# Patient Record
Sex: Female | Born: 1985 | Race: Black or African American | Hispanic: No | Marital: Single | Smoking: Former smoker
Health system: Southern US, Community
[De-identification: ages and names within clinical notes are randomized; demographics above are authoritative.]

## PROBLEM LIST (undated history)

## (undated) DIAGNOSIS — N83209 Unspecified ovarian cyst, unspecified side: Secondary | ICD-10-CM

---

## 2012-07-20 ENCOUNTER — Encounter (HOSPITAL_COMMUNITY): Payer: Self-pay

## 2012-07-20 ENCOUNTER — Emergency Department (HOSPITAL_COMMUNITY)
Admission: EM | Admit: 2012-07-20 | Discharge: 2012-07-20 | Disposition: A | Discharge status: Home / Self Care | Payer: Medicaid Other | Source: Home / Self Care | Attending: Emergency Medicine | Admitting: Emergency Medicine

## 2012-07-20 ENCOUNTER — Emergency Department (HOSPITAL_COMMUNITY)
Admission: EM | Admit: 2012-07-20 | Discharge: 2012-07-20 | Disposition: A | Discharge status: Home / Self Care | Payer: Medicaid Other | Attending: Emergency Medicine | Admitting: Emergency Medicine

## 2012-07-20 ENCOUNTER — Encounter (HOSPITAL_COMMUNITY): Payer: Self-pay | Admitting: *Deleted

## 2012-07-20 DIAGNOSIS — F17200 Nicotine dependence, unspecified, uncomplicated: Secondary | ICD-10-CM | POA: Insufficient documentation

## 2012-07-20 DIAGNOSIS — K0889 Other specified disorders of teeth and supporting structures: Secondary | ICD-10-CM

## 2012-07-20 DIAGNOSIS — K089 Disorder of teeth and supporting structures, unspecified: Secondary | ICD-10-CM | POA: Insufficient documentation

## 2012-07-20 DIAGNOSIS — Z87828 Personal history of other (healed) physical injury and trauma: Secondary | ICD-10-CM | POA: Insufficient documentation

## 2012-07-20 DIAGNOSIS — K047 Periapical abscess without sinus: Secondary | ICD-10-CM

## 2012-07-20 DIAGNOSIS — R220 Localized swelling, mass and lump, head: Secondary | ICD-10-CM | POA: Insufficient documentation

## 2012-07-20 MED ORDER — IBUPROFEN 800 MG PO TABS
800.0000 mg | ORAL_TABLET | Freq: Once | ORAL | Status: AC
  Administered 2012-07-20: 800 mg via ORAL
  Filled 2012-07-20: qty 1

## 2012-07-20 MED ORDER — IBUPROFEN 800 MG PO TABS: 800.0000 mg | ORAL_TABLET | Freq: Three times a day (TID) | ORAL | Status: DC

## 2012-07-20 MED ORDER — HYDROCODONE-ACETAMINOPHEN 5-325 MG PO TABS: 1.0000 | ORAL_TABLET | ORAL | Status: DC | PRN

## 2012-07-20 MED ORDER — AMOXICILLIN 500 MG PO CAPS: 500.0000 mg | ORAL_CAPSULE | Freq: Three times a day (TID) | ORAL | Status: AC

## 2012-07-20 NOTE — ED Notes (Signed)
Pt reports swelling to left lower gum for last 2 years, swelling and pain returned last night.

## 2012-07-20 NOTE — ED Notes (Signed)
Pt reports has had an abscess on her gum that comes and goes over the past 2 years.  Pt says abscess came back last night.

## 2012-07-20 NOTE — ED Provider Notes (Signed)
History     CSN: [id number]  Arrival date & time 07/20/12  1105   First MD Initiated Contact with Patient 07/20/12 1127      Chief Complaint  Patient presents with  . Dental Pain    (Consider location/radiation/quality/duration/timing/severity/associated sxs/prior treatment) HPI Comments: Lori Patrick is a 27 y.o. Female presenting with a 1 day history of dental pain and gingival swelling.   The patient has a history of injury to the tooth involved which has recently started to cause pain and swelling again.  She does have an appointment with Dr. Mylinda Latina in 3 days regarding this tooth. There has been no fevers,  Chills, nausea or vomiting, also no complaint of difficulty swallowing,  Although chewing makes pain worse.  The patient has tried ibuprofen with no relief of symptoms.         The history is provided by the patient.    History reviewed. No pertinent past medical history.  Past Surgical History  Procedure Laterality Date  . Cesarean section      No family history on file.  History  Substance Use Topics  . Smoking status: Current Every Day Smoker  . Smokeless tobacco: Not on file  . Alcohol Use: Yes     Comment: occ    OB History   Grav Para Term Preterm Abortions TAB SAB Ect Mult Living                  Review of Systems  Constitutional: Negative for fever.  HENT: Positive for dental problem. Negative for sore throat, facial swelling, neck pain and neck stiffness.   Respiratory: Negative for shortness of breath.     Allergies  Review of patient's allergies indicates no known allergies.  Home Medications   Current Outpatient Rx  Name  Route  Sig  Dispense  Refill  . amoxicillin (AMOXIL) 500 MG capsule   Oral   Take 1 capsule (500 mg total) by mouth 3 (three) times daily.   30 capsule   0   . HYDROcodone-acetaminophen (NORCO/VICODIN) 5-325 MG per tablet   Oral   Take 1 tablet by mouth every 4 (four) hours as needed for pain.   10 tablet   0     . ibuprofen (ADVIL,MOTRIN) 800 MG tablet   Oral   Take 1 tablet (800 mg total) by mouth 3 (three) times daily.   21 tablet   0     BP 121/87  Pulse 66  Temp(Src) 98.8 F (37.1 C) (Oral)  Resp 18  Ht 5\' 5"  (1.651 m)  Wt 144 lb (65.318 kg)  BMI 23.96 kg/m2  SpO2 99%  LMP 06/21/2012  Physical Exam  Constitutional: She is oriented to person, place, and time. She appears well-developed and well-nourished. No distress.  HENT:  Head: Normocephalic and atraumatic. No trismus in the jaw.  Right Ear: Tympanic membrane and external ear normal.  Left Ear: Tympanic membrane and external ear normal.  Mouth/Throat: Oropharynx is clear and moist and mucous membranes are normal. No oral lesions. Dental abscesses present.    Eyes: Conjunctivae are normal.  Neck: Normal range of motion. Neck supple.  Cardiovascular: Normal rate and normal heart sounds.   Pulmonary/Chest: Effort normal.  Abdominal: She exhibits no distension.  Musculoskeletal: Normal range of motion.  Lymphadenopathy:    She has no cervical adenopathy.  Neurological: She is alert and oriented to person, place, and time.  Skin: Skin is warm and dry. No erythema.  Psychiatric: She has  a normal mood and affect.    ED Course  Procedures (including critical care time)  Labs Reviewed - No data to display No results found.   1. Dental abscess       MDM  Prescribed amoxil and hydrocodone.  Advised peroxide and water swish and spit to keep cavity clean.  Keep appt with her dentist as planned.  The patient appears reasonably screened and/or stabilized for discharge and I doubt any other medical condition or other [facility] requiring further screening, evaluation, or treatment in the ED at this time prior to discharge.   Burgess Amor, PA-C 07/20/12 2317

## 2012-07-20 NOTE — ED Notes (Signed)
Dental pain, seen here today for same. Continues to hurt

## 2012-07-21 NOTE — ED Provider Notes (Signed)
Medical screening examination/treatment/procedure(s) were performed by non-physician practitioner and as supervising physician I was immediately available for consultation/collaboration.   Laray Anger, DO 07/21/12 1011

## 2012-07-21 NOTE — ED Provider Notes (Signed)
Medical screening examination/treatment/procedure(s) were performed by non-physician practitioner and as supervising physician I was immediately available for consultation/collaboration. Tailor Westfall, MD, FACEP   Laquitta Dominski L Rameen Quinney, MD 07/21/12 1449 

## 2012-07-21 NOTE — ED Provider Notes (Signed)
History     CSN: [id number]  Arrival date & time 07/20/12  1913   First MD Initiated Contact with Patient 07/20/12 1919      Chief Complaint  Patient presents with  . Dental Pain    (Consider location/radiation/quality/duration/timing/severity/associated sxs/prior treatment) HPI Comments: Patient returns to ED tonight after being seen earlier today for the same complaint.  States she has taken two the hydrocodone tablets prescribed earlier and the pain is not improved.  She states that she has an appt with Dr. Mylinda Latina in 3 days days.  She denies fever, vomiting , facial swelling ordifficulty swallowing.   Patient is a 27 y.o. female presenting with tooth pain. The history is provided by the patient.  Dental PainThe primary symptoms include mouth pain. Primary symptoms do not include dental injury, oral bleeding, oral lesions, headaches, fever, shortness of breath, sore throat, angioedema or cough. Episode onset: 24 hrs ago. The symptoms are unchanged. The symptoms are new. The symptoms occur constantly.  Additional symptoms include: dental sensitivity to temperature, gum swelling and gum tenderness. Additional symptoms do not include: purulent gums, trismus, jaw pain, facial swelling, trouble swallowing, pain with swallowing and ear pain. Medical issues include: smoking and periodontal disease.    History reviewed. No pertinent past medical history.  Past Surgical History  Procedure Laterality Date  . Cesarean section      History reviewed. No pertinent family history.  History  Substance Use Topics  . Smoking status: Current Every Day Smoker  . Smokeless tobacco: Not on file  . Alcohol Use: Yes     Comment: occ    OB History   Grav Para Term Preterm Abortions TAB SAB Ect Mult Living                  Review of Systems  Constitutional: Negative for fever, activity change and appetite change.  HENT: Positive for dental problem. Negative for ear pain, congestion, sore  throat, facial swelling, trouble swallowing, neck pain and neck stiffness.   Eyes: Negative for pain and visual disturbance.  Respiratory: Negative for cough and shortness of breath.   Neurological: Negative for dizziness, facial asymmetry and headaches.  Hematological: Negative for adenopathy.  All other systems reviewed and are negative.    Allergies  Review of patient's allergies indicates no known allergies.  Home Medications   Current Outpatient Rx  Name  Route  Sig  Dispense  Refill  . amoxicillin (AMOXIL) 500 MG capsule   Oral   Take 1 capsule (500 mg total) by mouth 3 (three) times daily.   30 capsule   0   . HYDROcodone-acetaminophen (NORCO/VICODIN) 5-325 MG per tablet   Oral   Take 1 tablet by mouth every 4 (four) hours as needed for pain.   10 tablet   0   . ibuprofen (ADVIL,MOTRIN) 800 MG tablet   Oral   Take 1 tablet (800 mg total) by mouth 3 (three) times daily.   21 tablet   0     BP 135/78  Pulse 82  Temp(Src) 98.2 F (36.8 C) (Oral)  Resp 24  Ht 5\' 5"  (1.651 m)  Wt 144 lb (65.318 kg)  BMI 23.96 kg/m2  SpO2 100%  LMP 06/21/2012  Physical Exam  Nursing note and vitals reviewed. Constitutional: She is oriented to person, place, and time. She appears well-developed and well-nourished. No distress.  HENT:  Head: Normocephalic and atraumatic. No trismus in the jaw.  Right Ear: Tympanic membrane and ear canal  normal.  Left Ear: Tympanic membrane and ear canal normal.  Mouth/Throat: Uvula is midline, oropharynx is clear and moist and mucous membranes are normal. Dental caries present. No dental abscesses or edematous.  Mild erythema of the left upper gums and dental decay of the upper left molars. No facial edema or trismus  Neck: Normal range of motion. Neck supple.  Cardiovascular: Normal rate, regular rhythm, normal heart sounds and intact distal pulses.   No murmur heard. Pulmonary/Chest: Effort normal and breath sounds normal.   Musculoskeletal: Normal range of motion.  Lymphadenopathy:    She has no cervical adenopathy.  Neurological: She is alert and oriented to person, place, and time. She exhibits normal muscle tone. Coordination normal.  Skin: Skin is warm and dry.    ED Course  Procedures (including critical care time)  Labs Reviewed - No data to display No results found.   1. Pain, dental       MDM    Patient was just seen here earlier today and treated for same complaint.  The previous ED chart was reviewed by me.    Given prescription for ibuprofen and advised to f/u with her dentist and use OTC Oro-gel.    The patient appears reasonably screened and/or stabilized for discharge and I doubt any other medical condition or other [facility] requiring further screening, evaluation, or treatment in the ED at this time prior to discharge.     Nhan Qualley L. Trisha Mangle, PA-C 07/21/12 1610

## 2012-08-03 ENCOUNTER — Emergency Department (HOSPITAL_COMMUNITY)
Admission: EM | Admit: 2012-08-03 | Discharge: 2012-08-03 | Disposition: A | Discharge status: Home / Self Care | Payer: Medicaid Other | Attending: Emergency Medicine | Admitting: Emergency Medicine

## 2012-08-03 ENCOUNTER — Encounter (HOSPITAL_COMMUNITY): Payer: Self-pay

## 2012-08-03 DIAGNOSIS — F17200 Nicotine dependence, unspecified, uncomplicated: Secondary | ICD-10-CM | POA: Insufficient documentation

## 2012-08-03 DIAGNOSIS — R5383 Other fatigue: Secondary | ICD-10-CM | POA: Insufficient documentation

## 2012-08-03 DIAGNOSIS — R5381 Other malaise: Secondary | ICD-10-CM | POA: Insufficient documentation

## 2012-08-03 DIAGNOSIS — R509 Fever, unspecified: Secondary | ICD-10-CM | POA: Insufficient documentation

## 2012-08-03 DIAGNOSIS — J039 Acute tonsillitis, unspecified: Secondary | ICD-10-CM | POA: Insufficient documentation

## 2012-08-03 LAB — RAPID STREP SCREEN (MED CTR MEBANE ONLY): Streptococcus, Group A Screen (Direct): NEGATIVE

## 2012-08-03 MED ORDER — IBUPROFEN 800 MG PO TABS
800.0000 mg | ORAL_TABLET | Freq: Once | ORAL | Status: AC
  Administered 2012-08-03: 800 mg via ORAL
  Filled 2012-08-03: qty 1

## 2012-08-03 MED ORDER — AZITHROMYCIN 250 MG PO TABS
500.0000 mg | ORAL_TABLET | Freq: Once | ORAL | Status: AC
  Administered 2012-08-03: 500 mg via ORAL
  Filled 2012-08-03: qty 2

## 2012-08-03 MED ORDER — AZITHROMYCIN 250 MG PO TABS: 250.0000 mg | ORAL_TABLET | Freq: Every day | ORAL | Status: DC

## 2012-08-03 MED ORDER — IBUPROFEN 600 MG PO TABS: 600.0000 mg | ORAL_TABLET | Freq: Four times a day (QID) | ORAL | Status: DC | PRN

## 2012-08-03 NOTE — ED Notes (Signed)
Sore throat for couple of days,

## 2012-08-03 NOTE — ED Notes (Signed)
nad noted prior to dc. Dc instructions reviewed and explained. rx given to pt and pt voiced understanding.

## 2012-08-05 NOTE — ED Provider Notes (Signed)
History     CSN: [id number]  Arrival date & time 08/03/12  2053   First MD Initiated Contact with Patient 08/03/12 2106      Chief Complaint  Patient presents with  . Sore Throat    (Consider location/radiation/quality/duration/timing/severity/associated sxs/prior treatment) Patient is a 27 y.o. female presenting with pharyngitis. The history is provided by the patient.  Sore Throat This is a new problem. Episode onset: 2 days ago. The problem occurs constantly. The problem has been gradually worsening. Associated symptoms include chills, fatigue, a fever, myalgias, a sore throat and swollen glands. Pertinent negatives include no abdominal pain, arthralgias, chest pain, congestion, coughing, headaches, joint swelling, nausea, neck pain, numbness, rash, vomiting or weakness. The symptoms are aggravated by swallowing. She has tried acetaminophen for the symptoms. The treatment provided no relief.    History reviewed. No pertinent past medical history.  Past Surgical History  Procedure Laterality Date  . Cesarean section      No family history on file.  History  Substance Use Topics  . Smoking status: Current Every Day Smoker  . Smokeless tobacco: Not on file  . Alcohol Use: Yes     Comment: occ    OB History   Grav Para Term Preterm Abortions TAB SAB Ect Mult Living                  Review of Systems  Constitutional: Positive for fever, chills and fatigue.  HENT: Positive for sore throat and trouble swallowing. Negative for congestion, rhinorrhea, neck pain, voice change and sinus pressure.   Eyes: Negative.   Respiratory: Negative for cough, chest tightness, shortness of breath and wheezing.   Cardiovascular: Negative for chest pain.  Gastrointestinal: Negative for nausea, vomiting and abdominal pain.  Genitourinary: Negative.   Musculoskeletal: Positive for myalgias. Negative for joint swelling and arthralgias.  Skin: Negative.  Negative for rash and wound.   Neurological: Negative for dizziness, weakness, light-headedness, numbness and headaches.  Psychiatric/Behavioral: Negative.     Allergies  Review of patient's allergies indicates no known allergies.  Home Medications   Current Outpatient Rx  Name  Route  Sig  Dispense  Refill  . acetaminophen (PAIN RELIEVER) 325 MG tablet   Oral   Take 325-650 mg by mouth daily as needed for pain.         Marland Kitchen azithromycin (ZITHROMAX) 250 MG tablet   Oral   Take 1 tablet (250 mg total) by mouth daily.   4 tablet   0   . ibuprofen (ADVIL,MOTRIN) 600 MG tablet   Oral   Take 1 tablet (600 mg total) by mouth every 6 (six) hours as needed for pain.   20 tablet   0     BP 132/78  Pulse 78  Temp(Src) 98.9 F (37.2 C) (Oral)  Resp 16  Ht 5\' 5"  (1.651 m)  Wt 144 lb (65.318 kg)  BMI 23.96 kg/m2  SpO2 100%  LMP 07/22/2012  Physical Exam  Constitutional: She is oriented to person, place, and time. She appears well-developed and well-nourished.  HENT:  Head: Normocephalic and atraumatic.  Right Ear: Tympanic membrane and ear canal normal.  Left Ear: Tympanic membrane and ear canal normal.  Nose: No mucosal edema or rhinorrhea.  Mouth/Throat: Uvula is midline and mucous membranes are normal. Posterior oropharyngeal edema and posterior oropharyngeal erythema present. No oropharyngeal exudate or tonsillar abscesses.  2+ bilateral tonsillar edema  Eyes: Conjunctivae are normal.  Cardiovascular: Normal rate and normal heart sounds.  Pulmonary/Chest: Effort normal. No respiratory distress. She has no wheezes. She has no rales.  Abdominal: Soft. There is no tenderness.  Musculoskeletal: Normal range of motion.  Lymphadenopathy:       Head (right side): Tonsillar adenopathy present.       Head (left side): Tonsillar adenopathy present.  Neurological: She is alert and oriented to person, place, and time.  Skin: Skin is warm and dry. No rash noted.  Psychiatric: She has a normal mood and  affect.    ED Course  Procedures (including critical care time)  Labs Reviewed  RAPID STREP SCREEN   No results found.   1. Acute tonsillitis       MDM  Pt placed on zithromax (as she had a recent course of amoxil due to dental infection(which was resolved with dental extraction).  Ibuprofen prescribed.  Encouraged rest,  Increased fluids, recheck if not improving over the next several days.        Burgess Amor, PA-C 08/05/12 [phone]

## 2012-08-05 NOTE — ED Provider Notes (Signed)
Medical screening examination/treatment/procedure(s) were performed by non-physician practitioner and as supervising physician I was immediately available for consultation/collaboration.  Donnetta Hutching, MD 08/05/12 1022

## 2012-08-22 ENCOUNTER — Emergency Department (HOSPITAL_COMMUNITY)
Admission: EM | Admit: 2012-08-22 | Discharge: 2012-08-22 | Disposition: A | Discharge status: Home / Self Care | Payer: Medicaid Other | Attending: Emergency Medicine | Admitting: Emergency Medicine

## 2012-08-22 ENCOUNTER — Encounter (HOSPITAL_COMMUNITY): Payer: Self-pay

## 2012-08-22 DIAGNOSIS — R319 Hematuria, unspecified: Secondary | ICD-10-CM | POA: Insufficient documentation

## 2012-08-22 DIAGNOSIS — R300 Dysuria: Secondary | ICD-10-CM | POA: Insufficient documentation

## 2012-08-22 DIAGNOSIS — N390 Urinary tract infection, site not specified: Secondary | ICD-10-CM

## 2012-08-22 DIAGNOSIS — Z3202 Encounter for pregnancy test, result negative: Secondary | ICD-10-CM | POA: Insufficient documentation

## 2012-08-22 DIAGNOSIS — Z79899 Other long term (current) drug therapy: Secondary | ICD-10-CM | POA: Insufficient documentation

## 2012-08-22 DIAGNOSIS — R109 Unspecified abdominal pain: Secondary | ICD-10-CM | POA: Insufficient documentation

## 2012-08-22 DIAGNOSIS — R350 Frequency of micturition: Secondary | ICD-10-CM | POA: Insufficient documentation

## 2012-08-22 LAB — POCT PREGNANCY, URINE: Preg Test, Ur: NEGATIVE

## 2012-08-22 LAB — URINALYSIS, ROUTINE W REFLEX MICROSCOPIC
Bilirubin Urine: NEGATIVE
Nitrite: POSITIVE — AB
Specific Gravity, Urine: 1.015 (ref 1.005–1.030)
Urobilinogen, UA: 0.2 mg/dL (ref 0.0–1.0)
pH: 6 (ref 5.0–8.0)

## 2012-08-22 LAB — URINE MICROSCOPIC-ADD ON

## 2012-08-22 MED ORDER — ACETAMINOPHEN 325 MG PO TABS
650.0000 mg | ORAL_TABLET | Freq: Once | ORAL | Status: AC
  Administered 2012-08-22: 650 mg via ORAL
  Filled 2012-08-22: qty 2

## 2012-08-22 MED ORDER — NITROFURANTOIN MONOHYD MACRO 100 MG PO CAPS
100.0000 mg | ORAL_CAPSULE | Freq: Once | ORAL | Status: AC
  Administered 2012-08-22: 100 mg via ORAL
  Filled 2012-08-22: qty 1

## 2012-08-22 MED ORDER — NITROFURANTOIN MONOHYD MACRO 100 MG PO CAPS: 100.0000 mg | ORAL_CAPSULE | Freq: Two times a day (BID) | ORAL | Status: DC

## 2012-08-22 MED ORDER — PHENAZOPYRIDINE HCL 100 MG PO TABS
200.0000 mg | ORAL_TABLET | Freq: Once | ORAL | Status: AC
  Administered 2012-08-22: 200 mg via ORAL
  Filled 2012-08-22: qty 2

## 2012-08-22 MED ORDER — PHENAZOPYRIDINE HCL 200 MG PO TABS: 200.0000 mg | ORAL_TABLET | Freq: Three times a day (TID) | ORAL | Status: DC | PRN

## 2012-08-22 NOTE — ED Provider Notes (Signed)
History     CSN: [id number]  Arrival date & time 08/22/12  9562   First MD Initiated Contact with Patient 08/22/12 [phone]      Chief Complaint  Patient presents with  . Back Pain    (Consider location/radiation/quality/duration/timing/severity/associated sxs/prior treatment) HPI Comments: Lori Patrick is a 27 y.o. Female who presents with a five-day history of increased urinary frequency, burning with urination, urgency and hematuria and bilateral low back pain which is described as aching, constant, better when supine and worse with walking.  She has had no nausea or vomiting and no documented fevers or chills.  She denies vaginal discharge and abdominal pain.  She denies flank pain, and does not have a personal or family history of kidney stones.  She has taken extra strength Tylenol, last dose yesterday evening which gives improvement in symptoms for several hours.     The history is provided by the patient.    History reviewed. No pertinent past medical history.  Past Surgical History  Procedure Laterality Date  . Cesarean section      No family history on file.  History  Substance Use Topics  . Smoking status: Current Every Day Smoker    Types: Cigarettes  . Smokeless tobacco: Not on file  . Alcohol Use: Yes     Comment: occ    OB History   Grav Para Term Preterm Abortions TAB SAB Ect Mult Living                  Review of Systems  Constitutional: Negative for fever.  HENT: Negative for congestion, sore throat and neck pain.   Eyes: Negative.   Respiratory: Negative for chest tightness and shortness of breath.   Cardiovascular: Negative for chest pain.  Gastrointestinal: Negative for nausea and abdominal pain.  Genitourinary: Positive for dysuria, urgency, frequency and hematuria. Negative for menstrual problem.  Musculoskeletal: Negative for joint swelling and arthralgias.  Skin: Negative.  Negative for rash and wound.  Neurological: Negative for dizziness,  weakness, light-headedness, numbness and headaches.  Psychiatric/Behavioral: Negative.     Allergies  Review of patient's allergies indicates no known allergies.  Home Medications   Current Outpatient Rx  Name  Route  Sig  Dispense  Refill  . acetaminophen (PAIN RELIEVER) 325 MG tablet   Oral   Take 325-650 mg by mouth daily as needed for pain.         Marland Kitchen azithromycin (ZITHROMAX) 250 MG tablet   Oral   Take 1 tablet (250 mg total) by mouth daily.   4 tablet   0   . ibuprofen (ADVIL,MOTRIN) 600 MG tablet   Oral   Take 1 tablet (600 mg total) by mouth every 6 (six) hours as needed for pain.   20 tablet   0   . nitrofurantoin, macrocrystal-monohydrate, (MACROBID) 100 MG capsule   Oral   Take 1 capsule (100 mg total) by mouth 2 (two) times daily.   13 capsule   0   . phenazopyridine (PYRIDIUM) 200 MG tablet   Oral   Take 1 tablet (200 mg total) by mouth 3 (three) times daily as needed for pain.   5 tablet   0     BP 138/75  Pulse 103  Temp(Src) 99.7 F (37.6 C) (Oral)  Ht 5\' 4"  (1.626 m)  Wt 140 lb (63.504 kg)  BMI 24.02 kg/m2  SpO2 100%  LMP 07/22/2012  Physical Exam  Nursing note and vitals reviewed. Constitutional: She appears well-developed  and well-nourished.  HENT:  Head: Normocephalic and atraumatic.  Eyes: Conjunctivae are normal.  Neck: Normal range of motion.  Cardiovascular: Normal rate, regular rhythm, normal heart sounds and intact distal pulses.   Pulmonary/Chest: Effort normal and breath sounds normal. She has no wheezes.  Abdominal: Soft. Bowel sounds are normal. She exhibits no distension. There is tenderness in the suprapubic area. There is no rigidity, no rebound, no guarding and no CVA tenderness.  Musculoskeletal: Normal range of motion.  Patient has pain bilateral lower back, no midline pain, and not reproducible on exam.  Neurological: She is alert.  Skin: Skin is warm and dry.  Psychiatric: She has a normal mood and affect.     ED Course  Procedures (including critical care time)  Labs Reviewed  URINALYSIS, ROUTINE W REFLEX MICROSCOPIC - Abnormal; Notable for the following:    APPearance CLOUDY (*)    Hgb urine dipstick SMALL (*)    Nitrite POSITIVE (*)    Leukocytes, UA LARGE (*)    All other components within normal limits  URINE MICROSCOPIC-ADD ON - Abnormal; Notable for the following:    Squamous Epithelial / LPF FEW (*)    Bacteria, UA MANY (*)    All other components within normal limits  URINE CULTURE  POCT PREGNANCY, URINE   No results found.   1. UTI (lower urinary tract infection)       MDM  Patient was given her first dose of Pyridium and Macrobid prior to discharge home.  Prescriptions for same.  Encouraged increased fluid.  Advised to return for worse pain, fevers or development of vomiting.  The patient appears reasonably screened and/or stabilized for discharge and I doubt any other medical condition or other [facility] requiring further screening, evaluation, or treatment in the ED at this time prior to discharge.         Burgess Amor, PA-C 08/22/12 0930

## 2012-08-22 NOTE — ED Notes (Signed)
Pt reports low back pain for 5 days, denies any known injury, thinks she may have a uti. Denies any vaginal discharge, no fever

## 2012-08-22 NOTE — ED Notes (Signed)
Pt alert & oriented x4, stable gait. Patient given discharge instructions, paperwork & prescription(s). Patient  instructed to stop at the registration desk to finish any additional paperwork. Patient verbalized understanding. Pt left department w/ no further questions. 

## 2012-08-22 NOTE — ED Provider Notes (Signed)
Medical screening examination/treatment/procedure(s) were performed by non-physician practitioner and as supervising physician I was immediately available for consultation/collaboration.  Bayley Hurn L Megin Consalvo, MD 08/22/12 1436 

## 2012-08-25 LAB — URINE CULTURE: Colony Count: >=100000

## 2012-08-26 NOTE — ED Notes (Signed)
Post ED Visit - Positive Culture Follow-up  Culture report reviewed by antimicrobial stewardship pharmacist: []  Wes Dulaney, Pharm.D., BCPS [x]  Celedonio Miyamoto, [address].D., BCPS []  Georgina Pillion, [address].D., BCPS []  [place], [address].D., BCPS, AAHIVP []  Estella Husk, Pharm.D., BCPS, AAHIV  Positive urine culture Treated with Macrobid, organism sensitive to the same and no further patient follow-up is required at this time.  Jiles Harold 08/26/2012, 4:46 PM

## 2015-03-04 ENCOUNTER — Encounter (HOSPITAL_COMMUNITY): Payer: Self-pay | Admitting: *Deleted

## 2015-03-04 ENCOUNTER — Emergency Department (HOSPITAL_COMMUNITY)
Admission: EM | Admit: 2015-03-04 | Discharge: 2015-03-05 | Disposition: A | Discharge status: Home / Self Care | Payer: Medicaid - Out of State | Attending: Emergency Medicine | Admitting: Emergency Medicine

## 2015-03-04 DIAGNOSIS — R55 Syncope and collapse: Secondary | ICD-10-CM | POA: Diagnosis not present

## 2015-03-04 DIAGNOSIS — R1010 Upper abdominal pain, unspecified: Secondary | ICD-10-CM | POA: Insufficient documentation

## 2015-03-04 DIAGNOSIS — Z87891 Personal history of nicotine dependence: Secondary | ICD-10-CM | POA: Insufficient documentation

## 2015-03-04 DIAGNOSIS — R61 Generalized hyperhidrosis: Secondary | ICD-10-CM | POA: Diagnosis not present

## 2015-03-04 DIAGNOSIS — R51 Headache: Secondary | ICD-10-CM | POA: Insufficient documentation

## 2015-03-04 DIAGNOSIS — R630 Anorexia: Secondary | ICD-10-CM | POA: Diagnosis not present

## 2015-03-04 DIAGNOSIS — Z8742 Personal history of other diseases of the female genital tract: Secondary | ICD-10-CM | POA: Diagnosis not present

## 2015-03-04 DIAGNOSIS — R42 Dizziness and giddiness: Secondary | ICD-10-CM | POA: Diagnosis not present

## 2015-03-04 DIAGNOSIS — R109 Unspecified abdominal pain: Secondary | ICD-10-CM

## 2015-03-04 DIAGNOSIS — R197 Diarrhea, unspecified: Secondary | ICD-10-CM | POA: Diagnosis not present

## 2015-03-04 DIAGNOSIS — R1030 Lower abdominal pain, unspecified: Secondary | ICD-10-CM | POA: Diagnosis not present

## 2015-03-04 DIAGNOSIS — R110 Nausea: Secondary | ICD-10-CM | POA: Insufficient documentation

## 2015-03-04 HISTORY — DX: Unspecified ovarian cyst, unspecified side: N83.209

## 2015-03-04 LAB — COMPREHENSIVE METABOLIC PANEL WITH GFR
ALT: 27 U/L (ref 14–54)
AST: 22 U/L (ref 15–41)
Albumin: 3.8 g/dL (ref 3.5–5.0)
Alkaline Phosphatase: 70 U/L (ref 38–126)
Anion gap: 7 (ref 5–15)
BUN: 9 mg/dL (ref 6–20)
CO2: 25 mmol/L (ref 22–32)
Calcium: 8.8 mg/dL — ABNORMAL LOW (ref 8.9–10.3)
Chloride: 107 mmol/L (ref 101–111)
Creatinine, Ser: 0.75 mg/dL (ref 0.44–1.00)
GFR calc Af Amer: >60 mL/min
GFR calc non Af Amer: >60 mL/min
Glucose, Bld: 94 mg/dL (ref 65–99)
Potassium: 3.3 mmol/L — ABNORMAL LOW (ref 3.5–5.1)
Sodium: 139 mmol/L (ref 135–145)
Total Bilirubin: 0.5 mg/dL (ref 0.3–1.2)
Total Protein: 6.5 g/dL (ref 6.5–8.1)

## 2015-03-04 LAB — CBC WITH DIFFERENTIAL/PLATELET
BASOS PCT: 1 %
Basophils Absolute: 0.1 10*3/uL (ref 0.0–0.1)
EOS ABS: 0.2 10*3/uL (ref 0.0–0.7)
Eosinophils Relative: 2 %
HCT: 41.6 % (ref 36.0–46.0)
HEMOGLOBIN: 13.7 g/dL (ref 12.0–15.0)
Lymphocytes Relative: 36 %
Lymphs Abs: 3.5 10*3/uL (ref 0.7–4.0)
MCH: 30.3 pg (ref 26.0–34.0)
MCHC: 32.9 g/dL (ref 30.0–36.0)
MCV: 92 fL (ref 78.0–100.0)
MONOS PCT: 6 %
Monocytes Absolute: 0.6 10*3/uL (ref 0.1–1.0)
NEUTROS PCT: 55 %
Neutro Abs: 5.4 10*3/uL (ref 1.7–7.7)
Platelets: 309 10*3/uL (ref 150–400)
RBC: 4.52 MIL/uL (ref 3.87–5.11)
RDW: 15.2 % (ref 11.5–15.5)
WBC: 9.6 10*3/uL (ref 4.0–10.5)

## 2015-03-04 LAB — I-STAT BETA HCG BLOOD, ED (MC, WL, AP ONLY): I-stat hCG, quantitative: <5 m[IU]/mL

## 2015-03-04 LAB — LIPASE, BLOOD: LIPASE: 30 U/L (ref 11–51)

## 2015-03-04 MED ORDER — ONDANSETRON HCL 4 MG/2ML IJ SOLN
4.0000 mg | Freq: Once | INTRAMUSCULAR | Status: AC
  Administered 2015-03-04: 4 mg via INTRAMUSCULAR
  Filled 2015-03-04: qty 2

## 2015-03-04 MED ORDER — SODIUM CHLORIDE 0.9 % IV BOLUS (SEPSIS)
1000.0000 mL | Freq: Once | INTRAVENOUS | Status: AC
  Administered 2015-03-04: 1000 mL via INTRAVENOUS

## 2015-03-04 MED ORDER — MORPHINE SULFATE (PF) 2 MG/ML IV SOLN
4.0000 mg | Freq: Once | INTRAVENOUS | Status: AC
Start: 2015-03-04 — End: 2015-03-04
  Administered 2015-03-04: 4 mg via INTRAVENOUS
  Filled 2015-03-04: qty 2

## 2015-03-04 NOTE — ED Provider Notes (Signed)
CSN: [id number]     Arrival date & time 03/04/15  2141 History  By signing my name below, I, Lori Patrick, attest that this documentation has been prepared under the direction and in the presence of Lori Guiseana Duo Lasonja Lakins, Patrick. Electronically Signed: Doreatha MartinEva Patrick, ED Patrick. 03/04/2015. 10:18 PM.    Chief Complaint  Patient presents with  . Loss of Consciousness   The history is provided by the patient. No language interpreter was used.    HPI Comments: Lori Patrick is a 29 y.o. female who is otherwise healthy who presents to the Emergency Department due to a witnessed syncopal episode that occurred just PTA. Family reports that she did not hit her head. Pt states she was lightheaded, diaphoretic and nauseated for a while before she went to use the restroom and experienced syncope while ambulating there. She notes that she had a sharp pain in her abdomen before passing out. She states associated 6/10 HA onset after syncope, lightheadedness today, nausea and 9/10 cramping suprapubic abdominal pain onset this morning, one episode of watery diarrhea today. Decreased appetite today.  Today was the last day of menstrual cycle. She states this cycle has not been abdormal. She states her periods typically last 5 days, with heavy bleeding the first 3 days. No h/o blood transfusion.  H/o cesarean section; no other abdominal surgeries. She denies vaginal discharge, dysuria, CP before syncope, fever.  Past Medical History  Diagnosis Date  . Ovarian cyst    Past Surgical History  Procedure Laterality Date  . Cesarean section     History reviewed. No pertinent family history. Social History  Substance Use Topics  . Smoking status: Former Smoker    Types: Cigarettes  . Smokeless tobacco: None  . Alcohol Use: Yes     Comment: occ   OB History    No data available     Review of Systems 10/14 systems reviewed and are negative other than those stated in the HPI.    Allergies  Review of patient's allergies  indicates no known allergies.  Home Medications   Prior to Admission medications   Medication Sig Start Date End Date Taking? Authorizing Provider  UNKNOWN TO PATIENT ANTIBIOTIC-NAME UNKNOWN   Yes Historical Provider, Patrick  UNKNOWN TO PATIENT Take by mouth every 6 (six) hours as needed. NAUSEA MEDICATION-NAME IS UNKNOWN   Yes Historical Provider, Patrick  UNKNOWN TO PATIENT Take 1 tablet by mouth daily. IRON SUPPLEMENT-NAME IS UNKNOWN   Yes Historical Provider, Patrick  ondansetron (ZOFRAN ODT) 8 MG disintegrating tablet Take 1 tablet (8 mg total) by mouth every 8 (eight) hours as needed for nausea or vomiting. 03/05/15   Lori BilisKevin Campos, Patrick   BP 106/81 mmHg  Pulse 58  Temp(Src) 98 F (36.7 C) (Oral)  Resp 16  Ht 5\' 5"  (1.651 m)  Wt 141 lb (63.957 kg)  BMI 23.46 kg/m2  SpO2 100%  LMP 02/27/2015 Physical Exam Physical Exam  Nursing note and vitals reviewed. Constitutional: Well developed, well nourished, non-toxic, and in no acute distress Head: Normocephalic and atraumatic.  Mouth/Throat: Oropharynx is clear. Dry mucous membranes. Neck: Normal range of motion. Neck supple.  Cardiovascular: Normal rate and regular rhythm.   Pulmonary/Chest: Effort normal and breath sounds normal.  Abdominal: Soft. There is no rebound and no guarding.  Non-distended. Upper abdominal tenderness.  Musculoskeletal: Normal range of motion.  Neurological: Alert, no facial droop, fluent speech, moves all extremities symmetrically Skin: Skin is warm and dry.  Psychiatric: Cooperative   ED  Course  Procedures (including critical care time) DIAGNOSTIC STUDIES: Oxygen Saturation is 100% on RA, normal by my interpretation.    COORDINATION OF CARE: 10:11 PM Discussed treatment plan with pt at bedside and pt agreed to plan.   Labs Review Labs Reviewed  COMPREHENSIVE METABOLIC PANEL - Abnormal; Notable for the following:    Potassium 3.3 (*)    Calcium 8.8 (*)    All other components within normal limits  CBC WITH  DIFFERENTIAL/PLATELET  LIPASE, BLOOD  I-STAT BETA HCG BLOOD, ED ([facility], [facility], [facility] ONLY)  TYPE AND SCREEN  I have personally reviewed and evaluated these lab results as part of my medical decision-making.   EKG Interpretation   Date/Time:  Saturday March 04 2015 22:04:58 EST Ventricular Rate:  80 PR Interval:  148 QRS Duration: 106 QT Interval:  387 QTC Calculation: 446 R Axis:   89 Text Interpretation:  Sinus rhythm No acute ST or T wave changes Normal  axis Normal intervals No prior for comparison  Confirmed by Lori Patrick, Lori Patrick  ([id number]) on 03/04/2015 10:27:21 PM       MDM   Final diagnoses:  Abdominal pain, unspecified abdominal location  Syncope, unspecified syncope type    In short, this is 29 year old female who presents with syncope in setting of abdominal pain. Has low BPs 80-90s on arrival, but not tachycardic. Abdomen soft and non-peritoneal but with diffuse tenderness. No free fluid on bedside ultrasound. Cardiopulmonary exam is unremarkable. EKg without ischemic changes or stigmata of arrhythmia. Basic blood work unremarkable overall. No major electrolyte or metabolic derangements. No anemia. She is not pregnant. CT performed to evaluate for early appendicitis vs other acute processes leading to syncope. This is negative for acute processes. Received IVF with improvement in symptoms and normalized BP. Overall syncope seems vasovagal in setting of severe abd pain today. Do not suspect serious or life threatening etiology of symptoms today. Felt she is stable for discharge home. Strict return and follow-up instructions reviewed. She expressed understanding of all discharge instructions and felt comfortable with the plan of care.    I personally performed the services described in this documentation, which was scribed in my presence. The recorded information has been reviewed and is accurate.   Lori Patrick 03/05/15 [phone]

## 2015-03-04 NOTE — ED Notes (Signed)
Pt c/o syncopal episode after feeling nauseated. Pt states she did not hit her head. Pt also c/o abdominal pain and headache. Headache came after syncopal episode.

## 2015-03-05 ENCOUNTER — Emergency Department (HOSPITAL_COMMUNITY): Payer: Medicaid - Out of State

## 2015-03-05 LAB — TYPE AND SCREEN
ABO/RH(D): B POS
Antibody Screen: NEGATIVE

## 2015-03-05 MED ORDER — IOHEXOL 300 MG/ML  SOLN
100.0000 mL | Freq: Once | INTRAMUSCULAR | Status: AC | PRN
  Administered 2015-03-05: 100 mL via INTRAVENOUS

## 2015-03-05 MED ORDER — ONDANSETRON 8 MG PO TBDP: 8.0000 mg | ORAL_TABLET | Freq: Three times a day (TID) | ORAL | Status: AC | PRN

## 2015-03-05 MED ORDER — BARIUM SULFATE 2 % PO SUSP: 450.0000 mL | Freq: Once | ORAL | Status: DC

## 2015-03-05 MED ORDER — SODIUM CHLORIDE 0.9 % IV BOLUS (SEPSIS)
1000.0000 mL | Freq: Once | INTRAVENOUS | Status: AC
  Administered 2015-03-05: 1000 mL via INTRAVENOUS

## 2015-03-05 NOTE — Discharge Instructions (Signed)
Abdominal Pain, Adult °Many things can cause abdominal pain. Usually, abdominal pain is not caused by a disease and will improve without treatment. It can often be observed and treated at home. Your health care provider will do a physical exam and possibly order blood tests and X-rays to help determine the seriousness of your pain. However, in many cases, more time must pass before a clear cause of the pain can be found. Before that point, your health care provider may not know if you need more testing or further treatment. °HOME CARE INSTRUCTIONS °Monitor your abdominal pain for any changes. The following actions may help to alleviate any discomfort you are experiencing: °· Only take over-the-counter or prescription medicines as directed by your health care provider. °· Do not take laxatives unless directed to do so by your health care provider. °· Try a clear liquid diet (broth, tea, or water) as directed by your health care provider. Slowly move to a bland diet as tolerated. °SEEK MEDICAL CARE IF: °· You have unexplained abdominal pain. °· You have abdominal pain associated with nausea or diarrhea. °· You have pain when you urinate or have a bowel movement. °· You experience abdominal pain that wakes you in the night. °· You have abdominal pain that is worsened or improved by eating food. °· You have abdominal pain that is worsened with eating fatty foods. °· You have a fever. °SEEK IMMEDIATE MEDICAL CARE IF: °· Your pain does not go away within 2 hours. °· You keep throwing up (vomiting). °· Your pain is felt only in portions of the abdomen, such as the right side or the left lower portion of the abdomen. °· You pass bloody or black tarry stools. °MAKE SURE YOU: °· Understand these instructions. °· Will watch your condition. °· Will get help right away if you are not doing well or get worse. °  °This information is not intended to replace advice given to you by your health care provider. Make sure you discuss  any questions you have with your health care provider. °  °Document Released: 01/16/2005 Document Revised: 12/28/2014 Document Reviewed: 12/16/2012 °Elsevier Interactive Patient Education ©2016 [employer] ° °Syncope °Syncope is a medical term for fainting or passing out. This means you lose consciousness and drop to the ground. People are generally unconscious for less than 5 minutes. You may have some muscle twitches for up to 15 seconds before waking up and returning to normal. Syncope occurs more often in older adults, but it can happen to anyone. While most causes of syncope are not dangerous, syncope can be a sign of a serious medical problem. It is important to seek medical care.  °CAUSES  °Syncope is caused by a sudden drop in blood flow to the brain. The specific cause is often not determined. Factors that can bring on syncope include: °· Taking medicines that lower blood pressure. °· Sudden changes in posture, such as standing up quickly. °· Taking more medicine than prescribed. °· Standing in one place for too long. °· Seizure disorders. °· Dehydration and excessive exposure to heat. °· Low blood sugar (hypoglycemia). °· Straining to have a bowel movement. °· Heart disease, irregular heartbeat, or other circulatory problems. °· Fear, emotional distress, seeing blood, or severe pain. °SYMPTOMS  °Right before fainting, you may: °· Feel dizzy or light-headed. °· Feel nauseous. °· See all white or all black in your field of vision. °· Have cold, clammy skin. °DIAGNOSIS  °Your health care provider will ask about   your symptoms, perform a physical exam, and perform an electrocardiogram (ECG) to record the electrical activity of your heart. Your health care provider may also perform other heart or blood tests to determine the cause of your syncope which may include: °· Transthoracic echocardiogram (TTE). During echocardiography, sound waves are used to evaluate how blood flows through your  heart. °· Transesophageal echocardiogram (TEE). °· Cardiac monitoring. This allows your health care provider to monitor your heart rate and rhythm in real time. °· Holter monitor. This is a portable device that records your heartbeat and can help diagnose heart arrhythmias. It allows your health care provider to track your heart activity for several days, if needed. °· Stress tests by exercise or by giving medicine that makes the heart beat faster. °TREATMENT  °In most cases, no treatment is needed. Depending on the cause of your syncope, your health care provider may recommend changing or stopping some of your medicines. °HOME CARE INSTRUCTIONS °· Have someone stay with you until you feel stable. °· Do not drive, use machinery, or play sports until your health care provider says it is okay. °· Keep all follow-up appointments as directed by your health care provider. °· Lie down right away if you start feeling like you might faint. Breathe deeply and steadily. Wait until all the symptoms have passed. °· Drink enough fluids to keep your urine clear or pale yellow. °· If you are taking blood pressure or heart medicine, get up slowly and take several minutes to sit and then stand. This can reduce dizziness. °SEEK IMMEDIATE MEDICAL CARE IF:  °· You have a severe headache. °· You have unusual pain in the chest, abdomen, or back. °· You are bleeding from your mouth or rectum, or you have black or tarry stool. °· You have an irregular or very fast heartbeat. °· You have pain with breathing. °· You have repeated fainting or seizure-like jerking during an episode. °· You faint when sitting or lying down. °· You have confusion. °· You have trouble walking. °· You have severe weakness. °· You have vision problems. °If you fainted, call your local emergency services (911 in [place] Do not drive yourself to the hospital.  °  °This information is not intended to replace advice given to you by your health care provider. Make sure  you discuss any questions you have with your health care provider. °  °Document Released: 04/08/2005 Document Revised: 08/23/2014 Document Reviewed: 06/07/2011 °Elsevier Interactive Patient Education ©2016 [employer] ° °

## 2016-10-15 IMAGING — CT CT ABD-PELV W/ CM
2 of 4 series · 16 of 46 positions shown, 18 images · IV contrast (Omnipaque 300)
Comparison: None.

CLINICAL DATA: Acute onset of suprapubic abdominal pain and
diarrhea. Syncope. Diaphoresis, lightheadedness and nausea. Initial
encounter.

EXAM:
CT ABDOMEN AND PELVIS WITH CONTRAST
TECHNIQUE: Multidetector CT imaging of the abdomen and pelvis was performed
using the standard protocol following bolus administration of
intravenous contrast.
CONTRAST:  100mL OMNIPAQUE IOHEXOL 300 MG/ML  SOLN

[Series 2: abd_pel_with 5.0 b40f · axial · 0.61mm/px · z∈[-412,+8]mm · 13 of 94 slices shown, 15 images]
[im 5/94  soft-tissue]
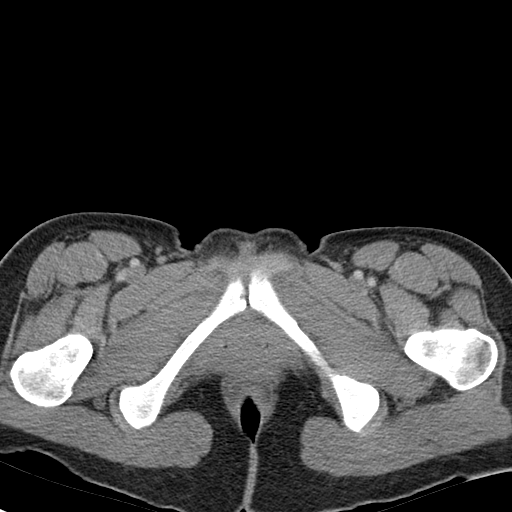
[im 5/94  bone]
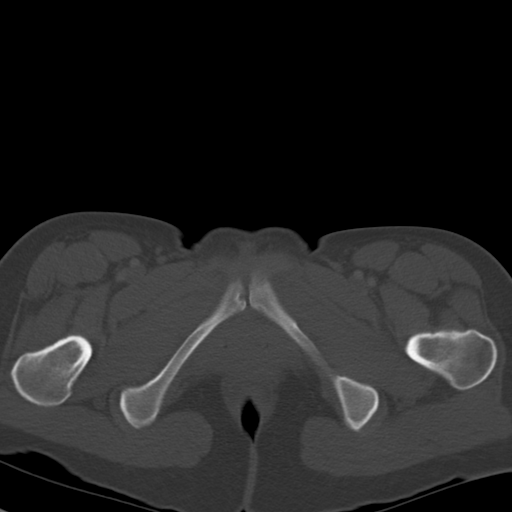
[im 13/94  soft-tissue]
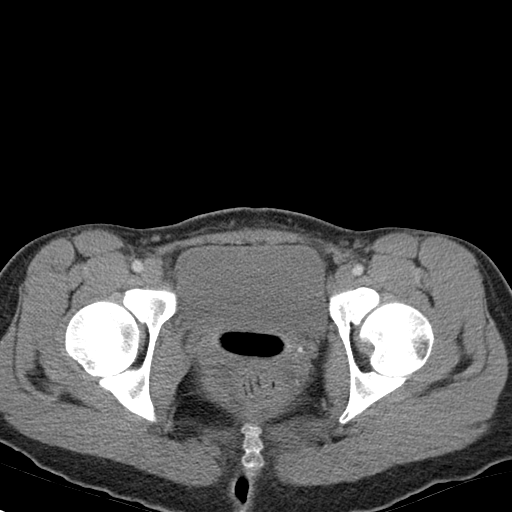
[im 21/94  soft-tissue]
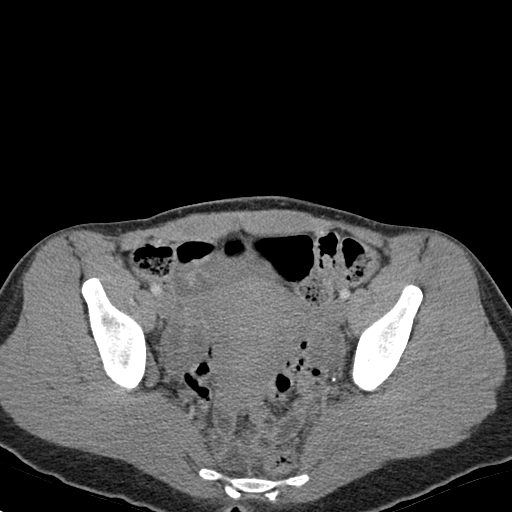
[im 25/94  soft-tissue]
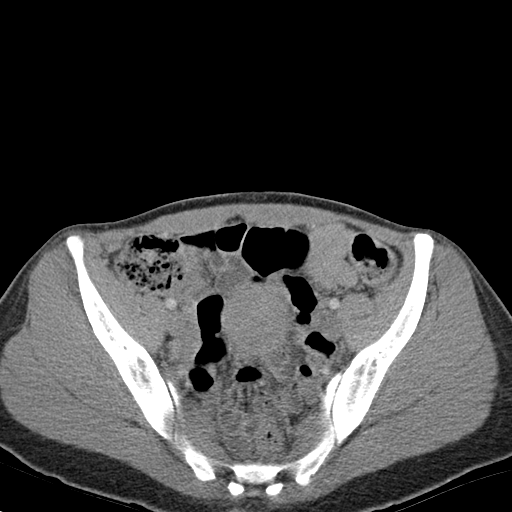
[im 33/94  soft-tissue]
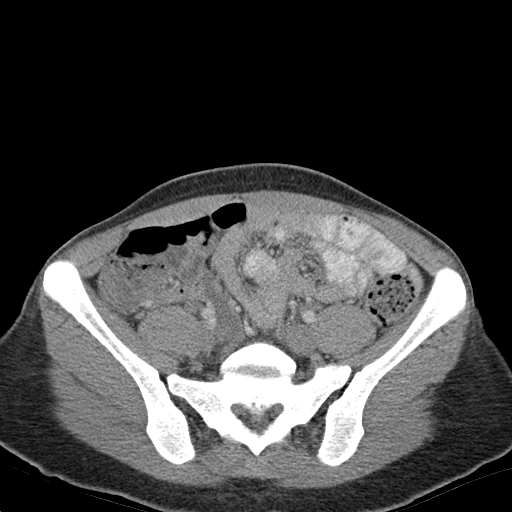
[im 41/94  soft-tissue]
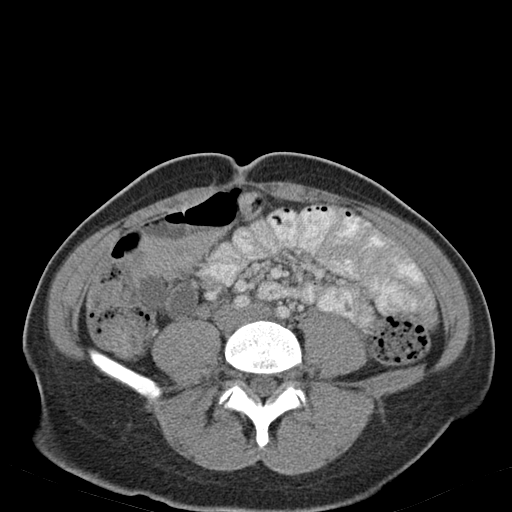
[im 49/94  soft-tissue]
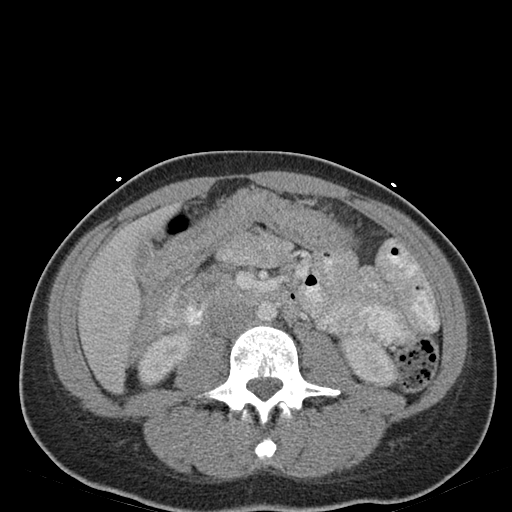
[im 53/94  soft-tissue]
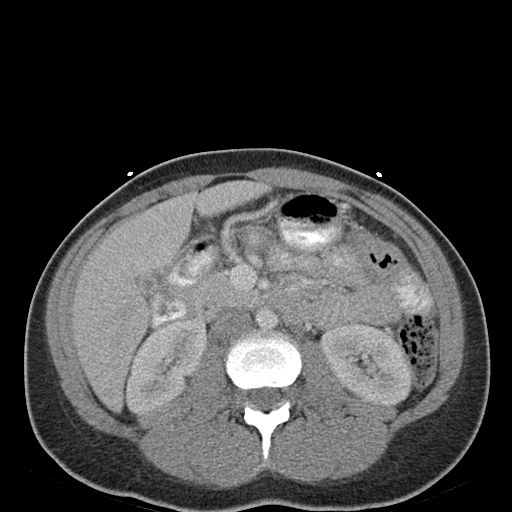
[im 61/94  soft-tissue]
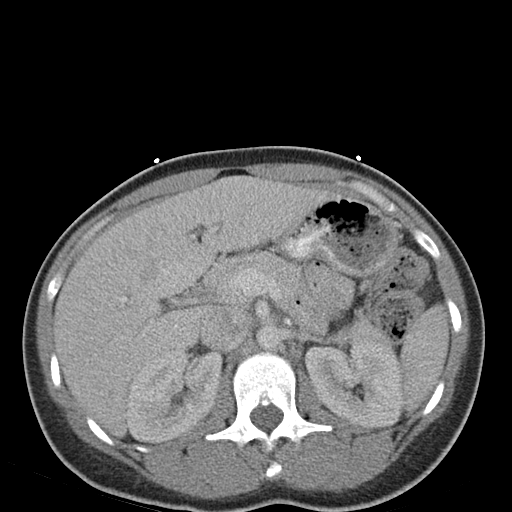
[im 61/94  bone]
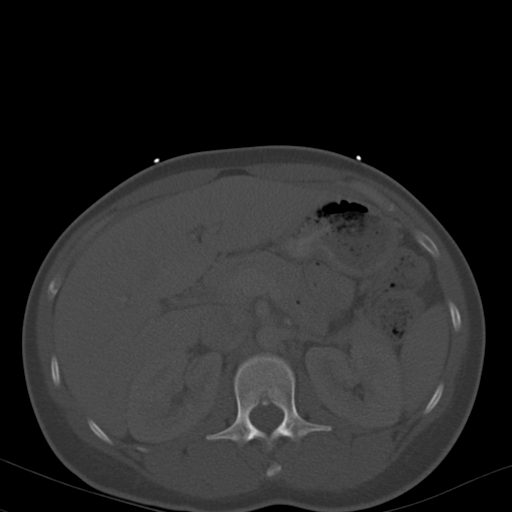
[im 69/94  soft-tissue]
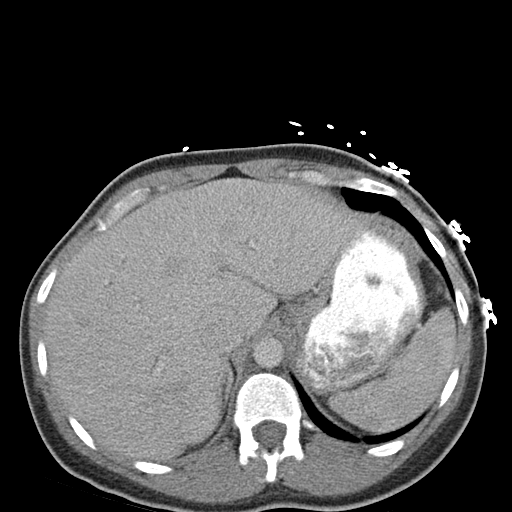
[im 73/94  soft-tissue]
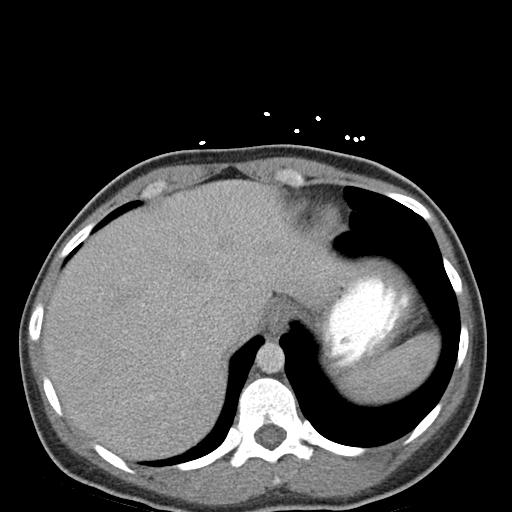
[im 81/94  soft-tissue]
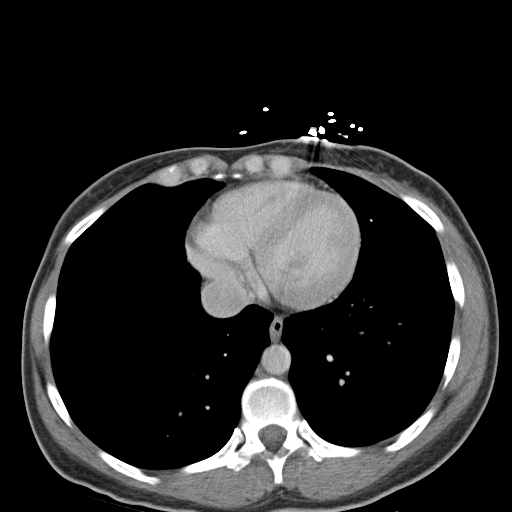
[im 89/94  soft-tissue]
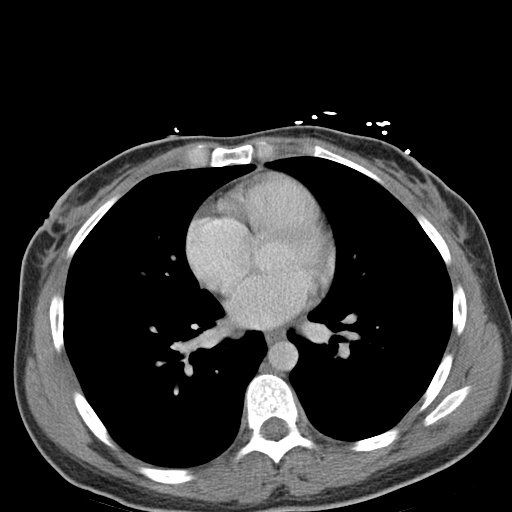

[Series 3: abd_pel_with 3.0 spo cor · coronal · 0.62mm/px · 3 of 86 slices shown]
[im 29/86  soft-tissue]
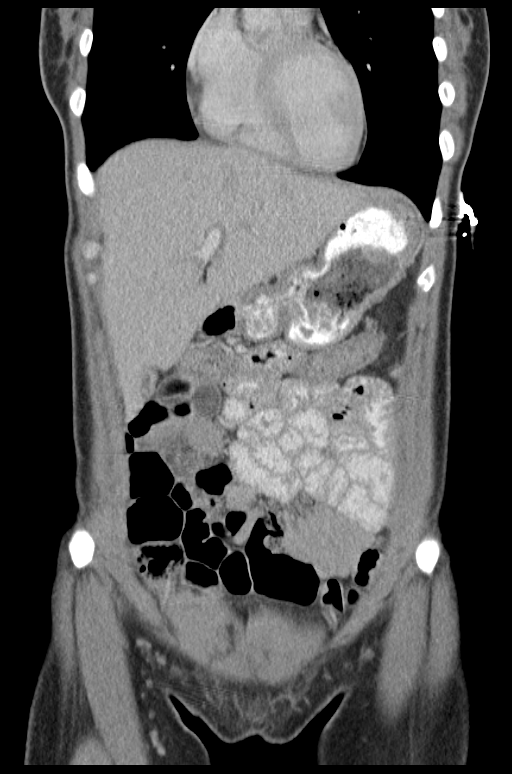
[im 38/86  soft-tissue]
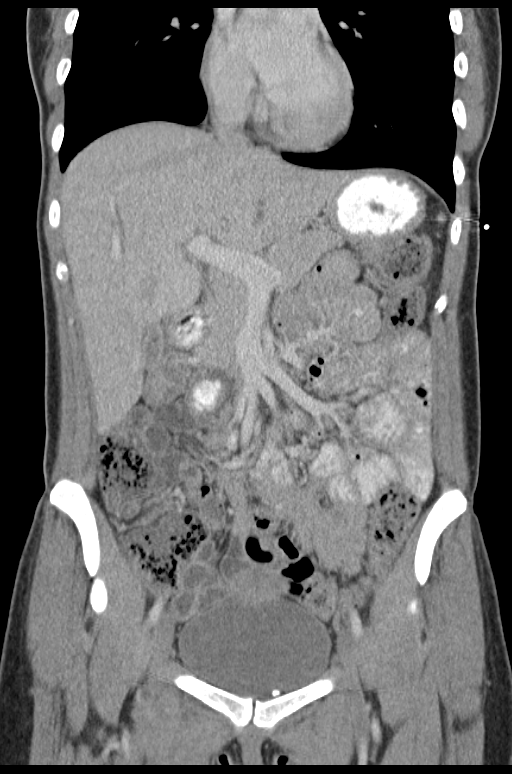
[im 48/86  soft-tissue]
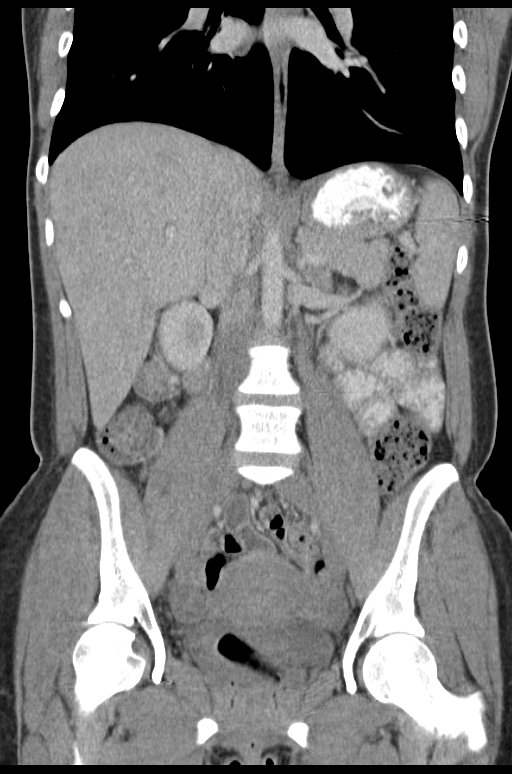

[16 of 46 positions shown; findings below may reference images not displayed]

FINDINGS: The visualized lung bases are clear.

The liver and spleen are unremarkable in appearance. The gallbladder
is within normal limits. The pancreas and adrenal glands are
unremarkable.

The kidneys are unremarkable in appearance. There is no evidence of
hydronephrosis. No renal or ureteral stones are seen. No perinephric
stranding is appreciated.

No free fluid is identified. The small bowel is unremarkable in
appearance. The stomach is within normal limits. No acute vascular
abnormalities are seen.

The appendix is normal in caliber, without evidence of appendicitis.
The colon is unremarkable in appearance.

The bladder is mildly distended and grossly unremarkable. The uterus
is unremarkable in appearance. The ovaries are grossly symmetric. A
tampon is noted at the vagina. No inguinal lymphadenopathy is seen.

No acute osseous abnormalities are identified.
IMPRESSION: Unremarkable contrast-enhanced CT of the abdomen and pelvis.
